# Patient Record
Sex: Female | Born: 1995 | Race: White | Hispanic: No | Marital: Single | Smoking: Never smoker
Health system: Southern US, Community
[De-identification: ages and names within clinical notes are randomized; demographics above are authoritative.]

## PROBLEM LIST (undated history)

## (undated) DIAGNOSIS — J302 Other seasonal allergic rhinitis: Secondary | ICD-10-CM

## (undated) DIAGNOSIS — J45909 Unspecified asthma, uncomplicated: Secondary | ICD-10-CM

## (undated) DIAGNOSIS — R0789 Other chest pain: Secondary | ICD-10-CM

## (undated) DIAGNOSIS — R002 Palpitations: Secondary | ICD-10-CM

## (undated) DIAGNOSIS — F419 Anxiety disorder, unspecified: Secondary | ICD-10-CM

## (undated) DIAGNOSIS — D164 Benign neoplasm of bones of skull and face: Secondary | ICD-10-CM

## (undated) DIAGNOSIS — R42 Dizziness and giddiness: Secondary | ICD-10-CM

## (undated) DIAGNOSIS — R000 Tachycardia, unspecified: Secondary | ICD-10-CM

## (undated) DIAGNOSIS — R55 Syncope and collapse: Secondary | ICD-10-CM

## (undated) DIAGNOSIS — R5383 Other fatigue: Secondary | ICD-10-CM

## (undated) HISTORY — DX: Tachycardia, unspecified: R00.0

## (undated) HISTORY — DX: Palpitations: R00.2

## (undated) HISTORY — PX: OTHER SURGICAL HISTORY: SHX169

## (undated) HISTORY — DX: Other fatigue: R53.83

## (undated) HISTORY — DX: Syncope and collapse: R55

## (undated) HISTORY — DX: Other chest pain: R07.89

## (undated) HISTORY — DX: Dizziness and giddiness: R42

## (undated) HISTORY — DX: Unspecified asthma, uncomplicated: J45.909

## (undated) HISTORY — DX: Benign neoplasm of bones of skull and face: D16.4

## (undated) HISTORY — PX: WISDOM TOOTH EXTRACTION: SHX21

## (undated) HISTORY — DX: Other seasonal allergic rhinitis: J30.2

## (undated) HISTORY — DX: Anxiety disorder, unspecified: F41.9

---
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
## 2013-01-30 ENCOUNTER — Encounter (HOSPITAL_COMMUNITY): Payer: Self-pay | Admitting: *Deleted

## 2013-01-30 ENCOUNTER — Emergency Department (HOSPITAL_COMMUNITY)
Admission: EM | Admit: 2013-01-30 | Discharge: 2013-01-30 | Disposition: A | Discharge status: Home / Self Care | Payer: BC Managed Care – PPO | Attending: Emergency Medicine | Admitting: Emergency Medicine

## 2013-01-30 ENCOUNTER — Emergency Department (HOSPITAL_COMMUNITY): Payer: BC Managed Care – PPO

## 2013-01-30 DIAGNOSIS — Z86011 Personal history of benign neoplasm of the brain: Secondary | ICD-10-CM | POA: Insufficient documentation

## 2013-01-30 DIAGNOSIS — R072 Precordial pain: Secondary | ICD-10-CM | POA: Insufficient documentation

## 2013-01-30 DIAGNOSIS — Z3202 Encounter for pregnancy test, result negative: Secondary | ICD-10-CM | POA: Insufficient documentation

## 2013-01-30 DIAGNOSIS — R55 Syncope and collapse: Secondary | ICD-10-CM

## 2013-01-30 HISTORY — DX: Syncope and collapse: R55

## 2013-01-30 NOTE — ED Notes (Signed)
Dr Radford Pax back in to see the pt

## 2013-01-30 NOTE — ED Notes (Signed)
The pt arrived by [facility] from [facility] where the pt felt faint and dropped to her knees but did not pass out. ems arrived and the pts heart rate was 140.  900 ml of nss infsued.  Heart rate in the 90s now.  She has had 2 other episodes in the past.  Iv per ems

## 2013-01-30 NOTE — ED Notes (Signed)
The pt is alert she last ate at 1430.  She had been outside for 1-2 hours.  lmp 2 weeks ago.  This is the third episode this month.  She lives in [place].  Her parents are on the way here

## 2013-01-30 NOTE — ED Provider Notes (Signed)
CSN: [id number]     Arrival date & time 01/30/13  1719 History   First MD Initiated Contact with Patient 01/30/13 1756     Chief Complaint  Patient presents with  . Near Syncope   (Consider location/radiation/quality/duration/timing/severity/associated sxs/prior Treatment) Patient is a 17 y.o. female presenting with general illness.  Illness Location:  Generalized Quality:  Near syncope Severity:  Moderate Onset quality:  Sudden Duration: 10 minutes. Timing:  Constant Progression:  Partially resolved Chronicity:  New Context:  While exercising Relieved by:  Rest and lying down Worsened by:  Exercise Associated symptoms: chest pain   Associated symptoms: no abdominal pain, no congestion, no cough, no diarrhea, no fever, no loss of consciousness, no nausea, no rash, no rhinorrhea, no shortness of breath, no sore throat and no vomiting     Past Medical History  Diagnosis Date  . Brain tumor    History reviewed. No pertinent past surgical history. No family history on file. History  Substance Use Topics  . Smoking status: Never Smoker   . Smokeless tobacco: Not on file  . Alcohol Use: No   OB History   Grav Para Term Preterm Abortions TAB SAB Ect Mult Living                 Review of Systems  Constitutional: Negative for fever and chills.  HENT: Negative for congestion, sore throat and rhinorrhea.   Eyes: Negative for photophobia and visual disturbance.  Respiratory: Negative for cough and shortness of breath.   Cardiovascular: Positive for chest pain. Negative for leg swelling.  Gastrointestinal: Negative for nausea, vomiting, abdominal pain, diarrhea and constipation.  Endocrine: Negative for polyphagia and polyuria.  Genitourinary: Negative for dysuria, flank pain, vaginal bleeding, vaginal discharge and enuresis.  Musculoskeletal: Negative for back pain and gait problem.  Skin: Negative for color change and rash.  Neurological: Negative for dizziness, loss of  consciousness, syncope, light-headedness and numbness.  Hematological: Negative for adenopathy. Does not bruise/bleed easily.  All other systems reviewed and are negative.    Allergies  Tomato  Home Medications  No current outpatient prescriptions on file. BP 115/69  Pulse 85  Temp(Src) 98.2 F (36.8 C) (Oral)  Resp 18  SpO2 100%  LMP 01/23/2013 Physical Exam  Vitals reviewed. Constitutional: She is oriented to person, place, and time. She appears well-developed and well-nourished.  HENT:  Head: Normocephalic and atraumatic.  Right Ear: External ear normal.  Left Ear: External ear normal.  Eyes: Conjunctivae and EOM are normal. Pupils are equal, round, and reactive to light.  Neck: Normal range of motion. Neck supple.  Cardiovascular: Normal rate, regular rhythm, normal heart sounds and intact distal pulses.   Pulmonary/Chest: Effort normal and breath sounds normal.  Abdominal: Soft. Bowel sounds are normal. There is no tenderness.  Musculoskeletal: Normal range of motion.  Neurological: She is alert and oriented to person, place, and time. She has normal strength and normal reflexes. No cranial nerve deficit or sensory deficit. Gait normal. GCS eye subscore is 4. GCS verbal subscore is 5. GCS motor subscore is 6.  Skin: Skin is warm and dry.    ED Course  Procedures (including critical care time) Labs Review Labs Reviewed  POCT PREGNANCY, URINE  Collection Time Result Time Preg Test, Ur 01/30/13 18:38:00 01/30/13 18:38:14 NEGATIVE THE SENSITIVITY OF THIS METHODOLOGY IS >24 mIU/mL  Imaging Review Dg Chest 2 View  01/30/2013   *RADIOLOGY REPORT*  Clinical Data: Shortness of breath, chest pain and cough.  CHEST -  2 VIEW  Comparison: None  Findings: The cardiomediastinal silhouette is unremarkable. The lungs are clear. There is no evidence of focal airspace disease, pulmonary edema, suspicious pulmonary nodule/mass, pleural effusion, or pneumothorax. No acute bony  abnormalities are identified.  IMPRESSION: No evidence of active cardiopulmonary disease.   Original Report Authenticated By: Harmon Pier, M.D.      Date: 01/30/2013  Rate: 99  Rhythm: normal sinus rhythm  QRS Axis: normal  Intervals: normal  ST/T Wave abnormalities: normal  Conduction Disutrbances:none  Narrative Interpretation:   Old EKG Reviewed: none available   MDM   1. Vasovagal near syncope    17 y.o. female  without pertinent PMH presents with near syncope today while exercising.  She has had similar episodes in the past and has not seen a physician for.  Physical exam on arrival unremarkable, pt with mild chest pain, substernal, nonradiating.  CXR and ecg as above unremarkable.  Consider likely etiology vasovagal near syncope, however given nature of symptoms will have pt fu with cardiology for holter monitor.  Pt given standard return precautions for syncope/near syncope, voiced understanding, agreed with plan, and will fu.  Doubt PNA, PTX, ACS, PE, or other symptoms given clinical picture, lack of dyspnea, and well appearance.    Labs and imaging as above reviewed by myself and attending,Dr. Radford Pax, with whom case was discussed.   1. Vasovagal near syncope         Noel Gerold, MD 01/31/13 [phone]

## 2013-01-30 NOTE — ED Notes (Signed)
Pt up to the br urine sent.  The pt has some mild lt chest pain still

## 2013-01-30 NOTE — ED Notes (Signed)
The pt returned from xray.  Alert no distress 

## 2013-01-30 NOTE — ED Notes (Signed)
The pts parents just arrived.  Her friends are gone.  The pt told her parents that she ran a marathon today earlier

## 2013-01-30 NOTE — ED Notes (Signed)
To x-ray

## 2013-01-31 NOTE — ED Provider Notes (Signed)
I saw and evaluated the patient, reviewed the resident's note and I agree with the findings and plan.   .Face to face Exam:  General:  Awake HEENT:  Atraumatic Resp:  Normal effort Abd:  Nondistended Neuro:No focal weakness   Nelia Shi, MD 01/31/13 1135

## 2013-02-03 DIAGNOSIS — R0789 Other chest pain: Secondary | ICD-10-CM

## 2013-02-03 HISTORY — DX: Other chest pain: R07.89

## 2013-02-19 ENCOUNTER — Ambulatory Visit (INDEPENDENT_AMBULATORY_CARE_PROVIDER_SITE_OTHER): Payer: BC Managed Care – PPO | Admitting: Cardiology

## 2013-02-19 ENCOUNTER — Encounter: Payer: Self-pay | Admitting: Cardiology

## 2013-02-19 VITALS — BP 120/80 | HR 72 | Ht 66.0 in | Wt 139.0 lb

## 2013-02-19 DIAGNOSIS — R55 Syncope and collapse: Secondary | ICD-10-CM

## 2013-02-19 LAB — CBC WITH DIFFERENTIAL/PLATELET
Basophils Absolute: 0 10*3/uL (ref 0.0–0.1)
Basophils Relative: 0.4 % (ref 0.0–3.0)
Eosinophils Absolute: 0.3 10*3/uL (ref 0.0–0.7)
Eosinophils Relative: 3.6 % (ref 0.0–5.0)
HCT: 40.7 % (ref 36.0–46.0)
Hemoglobin: 13.5 g/dL (ref 12.0–15.0)
Lymphocytes Relative: 23.2 % (ref 12.0–46.0)
Lymphs Abs: 1.7 10*3/uL (ref 0.7–4.0)
MCHC: 33.2 g/dL (ref 30.0–36.0)
MCV: 88.6 fl (ref 78.0–100.0)
Monocytes Absolute: 0.4 10*3/uL (ref 0.1–1.0)
Monocytes Relative: 6.1 % (ref 3.0–12.0)
Neutro Abs: 4.7 10*3/uL (ref 1.4–7.7)
Neutrophils Relative %: 66.7 % (ref 43.0–77.0)
Platelets: 287 10*3/uL (ref 150.0–400.0)
RBC: 4.59 Mil/uL (ref 3.87–5.11)
RDW: 12.8 % (ref 11.5–14.6)
WBC: 7.1 10*3/uL (ref 4.5–10.5)

## 2013-02-19 LAB — BASIC METABOLIC PANEL
BUN: 10 mg/dL (ref 6–23)
CO2: 29 mEq/L (ref 19–32)
Calcium: 9.6 mg/dL (ref 8.4–10.5)
Chloride: 103 mEq/L (ref 96–112)
Creatinine, Ser: 0.7 mg/dL (ref 0.4–1.2)
GFR: 113.94 mL/min (ref >60.00)
Glucose, Bld: 95 mg/dL (ref 70–99)
Potassium: 3.9 mEq/L (ref 3.5–5.1)
Sodium: 140 mEq/L (ref 135–145)

## 2013-02-19 LAB — TSH: TSH: 1.48 u[IU]/mL (ref 0.35–5.50)

## 2013-02-19 NOTE — Progress Notes (Signed)
Patient ID: Micala Saltsman, female   DOB: 07-02-1995, 17 y.o.   MRN: [id number]    Patient Name: Ariana Donovan Date of Encounter: 02/19/2013  Primary Care Provider:  No PCP Per Patient Primary Cardiologist:  Tobias Alexander, H   Patient Profile  Post ER visit  Problem List   Past Medical History  Diagnosis Date  . Brain tumor    History reviewed. No pertinent past surgical history.  Allergies  Allergies  Allergen Reactions  . Tomato Hives    HPI  17 year old female without pertinent PMH who visited ER on 01/30/2013 for near syncope. The episode happened while she was exercising.  She has had similar episodes in the past and has not seen a physician for. She also complained of mild chest pain, substernal, nonradiating.  CXR and physical exam were unremarkable. ECG showed rightward axis. Otherwise normal.  It was concluded as vasovagal syncope and she was sent home.   The patient describes that she has had 5 of those episodes since the Labor day. They all happened on different occasions, one time she was running relay, other time she walked to the gas station, another time she was watching TV and fell on the coach. Every time she looses vision and eventually concioussness, she is then confused for about 5 minutes based on her roommate interview. She denies urinary incontinence, muscle twitching or involuntary movements. She states that some of them were preceded by palpitations, in others she was not aware of them. On one occasion when EMS was called her HR was 160. She states that her episodes are followed by midsternal chest pain.   Home Medications  Prior to Admission medications   Not on File    Family History  History reviewed. No pertinent family history.  Social History  History   Social History  . Marital Status: Single    Spouse Name: N/A    Number of Children: N/A  . Years of Education: N/A   Occupational History  . Not on file.   Social History  Main Topics  . Smoking status: Never Smoker   . Smokeless tobacco: Not on file  . Alcohol Use: No  . Drug Use: Not on file  . Sexual Activity: Not on file   Other Topics Concern  . Not on file   Social History Narrative  . No narrative on file     Review of Systems General:  No chills, fever, night sweats or weight changes.  Cardiovascular:  No chest pain, dyspnea on exertion, edema, orthopnea, palpitations, paroxysmal nocturnal dyspnea. Dermatological: No rash, lesions/masses Respiratory: No cough, dyspnea Urologic: No hematuria, dysuria Abdominal:   No nausea, vomiting, diarrhea, bright red blood per rectum, melena, or hematemesis Neurologic:  No visual changes, wkns, changes in mental status. All other systems reviewed and are otherwise negative except as noted above.  Physical Exam  Blood pressure 120/80, pulse 72, height 5\' 6"  (1.676 m), weight 139 lb (63.05 kg), last menstrual period 01/23/2013.  General: Pleasant, NAD Psych: Normal affect. Neuro: Alert and oriented X 3. Moves all extremities spontaneously. HEENT: Normal  Neck: Supple without bruits or JVD. Lungs:  Resp regular and unlabored, CTA. Heart: RRR no s3, s4, or murmurs. Abdomen: Soft, non-tender, non-distended, BS + x 4.  Extremities: No clubbing, cyanosis or edema. DP/PT/Radials 2+ and equal bilaterally.  Accessory Clinical Findings  ECG - SR, 72 BPM, rightward axis  Assessment & Plan  A very pleasant young female with recurrent syncope in different  situations. It is somewhat concerning and the differential is broad. We have to rule out a structural heart disease and a flow limiting arrhythmia. We will order an event monitor to wear for a month and an echocardiogram.  We will also order BMP, TSH and CBC. We will refer her to a neurologist as a prolonged confusion is concerning for seizure disorder.   Follow up in 1 months unless there are any abnormal findings that would warrant an earlier  appointment.   Tobias Alexander, Rexene Edison, MD  02/19/2013, 1:59 PM

## 2013-02-19 NOTE — Patient Instructions (Addendum)
**Note De-Identified  Obfuscation** Your physician has recommended that you wear an event monitor. Event monitors are medical devices that record the heart's electrical activity. Doctors most often [place] these monitors to diagnose arrhythmias. Arrhythmias are problems with the speed or rhythm of the heartbeat. The monitor is a small, portable device. You can wear one while you do your normal daily activities. This is usually used to diagnose what is causing palpitations/syncope (passing out). You will wear for 30 days.  Your physician has requested that you have an echocardiogram. Echocardiography is a painless test that uses sound waves to create images of your heart. It provides your doctor with information about the size and shape of your heart and how well your heart's chambers and valves are working. This procedure takes approximately one hour. There are no restrictions for this procedure.  Your physician recommends that you return for lab work in: today  Your provider recommends that you see a Neurologist concerning syncope (passing out)  Your physician recommends that you schedule a follow-up appointment in: after Echo and Event monitor is completed.

## 2013-03-08 ENCOUNTER — Ambulatory Visit (INDEPENDENT_AMBULATORY_CARE_PROVIDER_SITE_OTHER): Payer: BC Managed Care – PPO | Admitting: Neurology

## 2013-03-08 ENCOUNTER — Encounter: Payer: Self-pay | Admitting: Neurology

## 2013-03-08 VITALS — BP 100/58 | HR 70 | Temp 97.9°F | Ht 67.0 in | Wt 145.0 lb

## 2013-03-08 DIAGNOSIS — R55 Syncope and collapse: Secondary | ICD-10-CM

## 2013-03-08 NOTE — Progress Notes (Signed)
NEUROLOGY CONSULTATION NOTE  Ariana Donovan MRN: [id number] DOB: 03/18/1996  Referring provider: Dr. Delton See (cardiology).  Reason for consult:  Syncope vs Seizure  HISTORY OF PRESENT ILLNESS: Ariana Donovan is a 17 year old right-handed woman with remote history of benign scalp tumor resection who presents for episodes of syncope.  Records and images were personally reviewed where available.    She reportedly had 8 episodes since Labor Day.  Semiology is pretty much the same.  They can occur while running, walking or sitting on the couch.  She will experience palpitations, diaphoresis, overheating and tunnel vision, followed by blacking out.  There is no preceding lightheadedness.  She reportedly is unconscious for 3 minutes.  There are no convulsions, focal body twitching, posturing, tongue biting, or urinary or bladder incontinence.  When she wakes up, she is sometimes confused for 2-3 minutes, but not all the time.  They do not seem to be triggered by anything in particular.  These have been witnessed events.  Last episode occurred this past weekend while at a party.  She presented to the ED in September, where ECG showed rightward axis.  She was diagnosed with vasovagal syncope.  She followed up with cardiology on 02/19/13.  Event monitor and echocardiogram were ordered, which will be started tomorrow.  Given the confusion, there was concern for possible seizure disorder prompted referral to neurology.  She does not have past history of seizures.  She does not have history of head trauma or encephalitis.  She has family history of epilepsy with distant cousins.  She does not use drugs.  She does drive.  02/19/13:  CBC, BMP and TSH normal.  PAST MEDICAL HISTORY: Past Medical History  Diagnosis Date  . Brain tumor   . Asthma     PAST SURGICAL HISTORY: Past Surgical History  Procedure Laterality Date  . Wisdom tooth extraction      MEDICATIONS: No current outpatient prescriptions  on file prior to visit.   No current facility-administered medications on file prior to visit.    ALLERGIES: Allergies  Allergen Reactions  . Tomato Hives    FAMILY HISTORY: No family history on file.  SOCIAL HISTORY: History   Social History  . Marital Status: Single    Spouse Name: N/A    Number of Children: N/A  . Years of Education: N/A   Occupational History  . Not on file.   Social History Main Topics  . Smoking status: Never Smoker   . Smokeless tobacco: Never Used  . Alcohol Use: No     Comment: socially  . Drug Use: No  . Sexual Activity: Not on file   Other Topics Concern  . Not on file   Social History Narrative  . No narrative on file    REVIEW OF SYSTEMS: Constitutional: No fevers, chills, or sweats, no generalized fatigue, change in appetite Eyes: No visual changes, double vision, eye pain Ear, nose and throat: No hearing loss, ear pain, nasal congestion, sore throat Cardiovascular: No chest pain, palpitations Respiratory:  No shortness of breath at rest or with exertion, wheezes GastrointestinaI: No nausea, vomiting, diarrhea, abdominal pain, fecal incontinence Genitourinary:  No dysuria, urinary retention or frequency Musculoskeletal:  No neck pain, back pain Integumentary: No rash, pruritus, skin lesions Neurological: as above Psychiatric: No depression, insomnia, anxiety Endocrine: No palpitations, fatigue, diaphoresis, mood swings, change in appetite, change in weight, increased thirst Hematologic/Lymphatic:  No anemia, purpura, petechiae. Allergic/Immunologic: no itchy/runny eyes, nasal congestion, recent allergic reactions, rashes  PHYSICAL EXAM: Filed Vitals:   03/08/13 0849  BP: 100/58  Pulse: 70  Temp: 97.9 F (36.6 C)   General: No acute distress Head:  Normocephalic/atraumatic Neck: supple, no paraspinal tenderness, full range of motion Back: No paraspinal tenderness Heart: regular rate and rhythm Lungs: Clear to  auscultation bilaterally. Vascular: No carotid bruits. Neurological Exam: Mental status: alert and oriented to person, place, and time, speech fluent and not dysarthric, language intact. Cranial nerves: CN I: not tested CN II: pupils equal, round and reactive to light, visual fields intact, fundi unremarkable. CN III, IV, VI:  full range of motion, no nystagmus, no ptosis CN V: facial sensation intact CN VII: upper and lower face symmetric CN VIII: hearing intact CN IX, X: gag intact, uvula midline CN XI: sternocleidomastoid and trapezius muscles intact CN XII: tongue midline Bulk & Tone: normal, no fasciculations. Motor: 5/5 throughout Sensation: temperature and pinprick intact Deep Tendon Reflexes: 2+ throughout, toes down Finger to nose testing: normal Heel to shin: normal Gait: normal.  Able to walk on toes, heels and in tandem. Romberg negative.  IMPRESSION: Recurrent syncope.  The semiology of the events are overwhelmingly consistent with syncope (vasovagal or cardiogenic given the palpitations). However, the reported occasional confusion prompts seizure workup as well.   PLAN: 1.  MRI of brain with seizure protocol 2.  Sleep-deprived EEG 3.  I will forego starting an antiepileptic at this time, as the symptoms seem more likely syncope than seizure.  We will see what test results show. 4.  Follow up in 6 weeks, after results of tests, including event monitor. 5.  Since these seem to be unprovoked events (regardless of being seizures), I discussed [place] law regarding driving restrictions for 6 months from last loss of consciousness event.  45 minutes spent with patient, over 50% spent counseling and coordinating care.  Thank you for allowing me to take part in the care of this patient.  Shon Millet, DO  CC:  Tobias Alexander, MD.

## 2013-03-08 NOTE — Patient Instructions (Addendum)
Your episodes sound like fainting, except for the confusion afterwards.  Otherwise, they really don't seem like seizures to me. 1.  We will see what the heart monitor shows. 2.  We will also get an MRI of the brain and EEG to record your brainwaves, to look for any evidence of seizure-risk. 3.  We won't start a seizure medication at this time, but we can start one pending results of the tests or if you keep having spells. 4.  [place] law restricts driving for 6 months from last seizure.  Even though these may not be seizures, they are unprovoked events causing loss of consciousness and this applies as well.  I recommend making arrangements for transportation, such as help from friends. 5.  Follow up in 6 weeks.  Your sleep deprived EEG is scheduled at [facility] on Thursday, November 13th at 8:00 am.  Please arrive at first floor admitting fifteen minutes prior to your appointment. Stay awake from midnight forward until your test time. Avoid caffeine. Enter the hospital off of [address] at [address].    [phone].  Your MRI is scheduled at [facility] on Thursday, November 6th at 2:00 pm. Please check in at the first floor radiology department 15 minutes prior to your scheduled appointment time. Enter the hospital off of [address] at [address].     [phone].

## 2013-03-09 ENCOUNTER — Encounter (INDEPENDENT_AMBULATORY_CARE_PROVIDER_SITE_OTHER): Payer: BC Managed Care – PPO

## 2013-03-09 ENCOUNTER — Ambulatory Visit (HOSPITAL_COMMUNITY): Payer: BC Managed Care – PPO | Attending: Cardiology | Admitting: Radiology

## 2013-03-09 ENCOUNTER — Encounter: Payer: Self-pay | Admitting: *Deleted

## 2013-03-09 DIAGNOSIS — R55 Syncope and collapse: Secondary | ICD-10-CM

## 2013-03-09 NOTE — Progress Notes (Signed)
Echocardiogram performed.  

## 2013-03-09 NOTE — Progress Notes (Signed)
Patient ID: Ariana Donovan, female   DOB: May 08, 1995, 17 y.o.   MRN: [id number] E-Cardio Verite 30 day cardiac event monitor applied to patient.

## 2013-03-10 ENCOUNTER — Telehealth: Payer: Self-pay

## 2013-03-10 ENCOUNTER — Telehealth: Payer: Self-pay | Admitting: Internal Medicine

## 2013-03-10 ENCOUNTER — Telehealth: Payer: Self-pay | Admitting: Nurse Practitioner

## 2013-03-10 MED ORDER — METOPROLOL TARTRATE 25 MG PO TABS: 25.0000 mg | ORAL_TABLET | Freq: Two times a day (BID) | ORAL | Status: DC

## 2013-03-10 NOTE — Telephone Encounter (Signed)
Pt is advised per Dr. Delton See not to drive until she is evaluated by Dr Johney Frame on 11/7, she verbalized understanding.

## 2013-03-10 NOTE — Telephone Encounter (Signed)
Received eCardio report of SVT at rate of 161 yesterday at 4:43 pm.  I called patient who reports she did get a call from [facility] yesterday and that at the time of the recorded event she was watching television.  Patient states at the time she did feel that her heart was beating "a little bit fast."  Patient reports this morning that she is a little nauseated but that this is not uncommon for her in the mornings.  I had patient to check her pulse while we were on the telephone and it is approximately 116 bpm.  I advised patient that I will report to Dr. Delton See and that if she has any advice, that we will call her back; otherwise to call us if she has concerns or questions.

## 2013-03-10 NOTE — Telephone Encounter (Signed)
**Note De-Identified  Obfuscation** Per Dr. Delton See the Ariana Donovan needs a consult with EP for SVT per event monitor. The Ariana Donovan is advised and she agrees with plan so an appt has been scheduled for the Ariana Donovan to see Dr Johney Frame on 11/7 (this Friday) at 8:45. Also, per Dr Delton See the Ariana Donovan is advised to start taking Metoprolol 25 mg BID and that if she has fatigue weakness or dizziness after starting Metoprolol to cut dose in half, she verbalized understanding. RX sent to [facility] on [address] to fill per Ariana Donovan request.

## 2013-03-10 NOTE — Telephone Encounter (Signed)
Following up     Pt is returning your call.  Pt thinks the call is about a scheduling issue.

## 2013-03-11 ENCOUNTER — Telehealth: Payer: Self-pay

## 2013-03-11 ENCOUNTER — Ambulatory Visit (HOSPITAL_COMMUNITY): Admission: RE | Admit: 2013-03-11 | Payer: BC Managed Care – PPO | Source: Ambulatory Visit

## 2013-03-11 NOTE — Telephone Encounter (Signed)
Pt is scheduled to see Dr Johney Frame on 03/15/13 @ 9 am for SVT per Dr Delton See.

## 2013-03-12 ENCOUNTER — Telehealth: Payer: Self-pay | Admitting: *Deleted

## 2013-03-12 ENCOUNTER — Institutional Professional Consult (permissible substitution): Payer: BC Managed Care – PPO | Admitting: Internal Medicine

## 2013-03-12 NOTE — Telephone Encounter (Signed)
Left message for patient to call back. E-Cardio report showing ST/SVT, rates 155 , 164. Patient reported asymptomatic at home sitting watching television. Dr. Jens Som (DOD) reviewed, no new orders received - only to continue current therapy with Metoprolol.

## 2013-03-12 NOTE — Telephone Encounter (Signed)
Left message - told patient to only call back if she was symptomatic. She was returning my phone call from [facility] report showing ST/SVT. See other telephone message from today for greater detail.

## 2013-03-12 NOTE — Telephone Encounter (Signed)
Folow Up:  Pt states she is returning Sherri's call... Please advise

## 2013-03-15 ENCOUNTER — Encounter: Payer: Self-pay | Admitting: Internal Medicine

## 2013-03-15 ENCOUNTER — Ambulatory Visit (INDEPENDENT_AMBULATORY_CARE_PROVIDER_SITE_OTHER): Payer: BC Managed Care – PPO | Admitting: Internal Medicine

## 2013-03-15 VITALS — BP 100/70 | HR 88 | Ht 67.0 in | Wt 143.1 lb

## 2013-03-15 DIAGNOSIS — R55 Syncope and collapse: Secondary | ICD-10-CM | POA: Insufficient documentation

## 2013-03-15 NOTE — Progress Notes (Signed)
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 female with a h/o recurrent syncope who presents for EP consultation.  She reports being healthy, without any cardiology concerns until 8/14.  Since that time, she has had multiple episodes of dizziness, presyncope, and syncope.  She reports that her first episode occurred labor day weekend.  She was driving and felt that her vision was blurred.  She felt "fuzzy headed".  She pulled over and went into a convenience store.  Upon walking in, she collapsed with brief loss of consciousness.  She reports that her syncope was very brief (few seconds) though it did take her several minutes to regain composure.  She was evaluated by EMS and sent home.  She reports that about a month ago she was running a relay.  She felt fine while running.  Upon immediately stopping the run, she had abrupt lightheadedness with chest tightness and layed down.  She did not have full loss of consciousness but did have presyncope.  She has occasional tachypalpitations.  She is wearing an event monitor but has not had syncope while wearing it.  She does have occasional dizziness.  Her monitor has revealed only sinus rhythm and sinus tachycardia. Today, she denies symptoms of exertional chest pain, shortness of breath, orthopnea, PND, lower extremity edema, or neurologic sequela. The patient is tolerating medications without difficulties and is otherwise without complaint today.   Past Medical History  Diagnosis Date  . 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 Asthma   . Near syncope 01/30/13  . Heart palpitations     Happened during one of her syncope episodes  . Chest pain, mid sternal     Happens during syncope episodes   Past Surgical History  Procedure Laterality Date  . Wisdom tooth extraction    . Skull tumor resection      Current Outpatient Prescriptions  Medication Sig Dispense Refill  . metoprolol tartrate (LOPRESSOR) 25 MG tablet Take 1  tablet (25 mg total) by mouth 2 (two) times daily.  60 tablet  3   No current facility-administered medications for this visit.    Allergies  Allergen Reactions  . Tomato Hives    History   Social History  . Marital Status: Single    Spouse Name: N/A    Number of Children: N/A  . Years of Education: N/A   Occupational History  . Not on file.   Social History Main Topics  . Smoking status: Never Smoker   . Smokeless tobacco: Never Used  . Alcohol Use: Yes     Comment: socially  . Drug Use: No  . Sexual Activity: Not on file   Other Topics Concern  . 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    Family History  Problem Relation Age of Onset  . Ataxia Neg Hx   . Chorea Neg Hx   . Dementia Neg Hx   . Mental retardation Neg Hx   . Migraines Neg Hx   . Multiple sclerosis Neg Hx   . Neurofibromatosis Neg Hx   . Neuropathy Neg Hx   . Parkinsonism Neg Hx   . Seizures Neg Hx   . Stroke Neg Hx   . Heart disease Maternal Grandmother   . Heart disease Maternal Aunt   denies FH of sudden death, syncope, or arrhythmias  ROS- All systems are reviewed and negative except  as per the HPI above  Physical Exam: Filed Vitals:   03/15/13 1035 03/15/13 1036 03/15/13 1037 03/15/13 1038  BP: 106/71 108/74 109/75 100/70  Pulse: 76 80 81 88  Height:      Weight:      SpO2: 90% 95% 97% 98%    GEN- The patient is well appearing, alert and oriented x 3 today.   Head- normocephalic, atraumatic Eyes-  Sclera clear, conjunctiva pink Ears- hearing intact Oropharynx- clear Neck- supple, no JVP Lymph- no cervical lymphadenopathy Lungs- Clear to ausculation bilaterally, normal work of breathing Heart- Regular rate and rhythm, no murmurs, rubs or gallops, PMI not laterally displaced GI- soft, NT, ND, + BS Extremities- no clubbing, cyanosis, or edema MS- no significant deformity or atrophy Skin- no rash or lesion Psych- euthymic mood, full  affect Neuro- strength and sensation are intact  EKG 02/19/13- sinus rhythm 72 bpm, RAD, QTc 383 Echo 03/09/13- EF 55-60% with normal RV size/ function  Assessment and Plan:  1. Syncope She is orthostatic by HR today.  I suspect that she likely has mild dysautonomia.  I have encouraged lifestyle modification including increasing salt/ hydration and avoidance of quick change in position or quick cessation of activity.  She will continue to wear her event monitor and liberalize her salt.  I have stopped metoprolol today.  She will return in 6 weeks to follow-up on results of her monitor.  I have instructed her to not drive with these episodes ongoing.

## 2013-03-15 NOTE — Patient Instructions (Signed)
Your physician recommends that you schedule a follow-up appointment in: 6 weeks with Dr Johney Frame   Increase SALT and Stay hydrated

## 2013-03-17 ENCOUNTER — Telehealth: Payer: Self-pay | Admitting: Nurse Practitioner

## 2013-03-17 NOTE — Telephone Encounter (Signed)
Received eCardio report with episode of questionable SVT/Sinus Tachycardia.  I called patient who reports that at the time of the recorded event (0952) she was in the bed.  Patient states she got out of bed this morning at approximately 1000.  Patient reports that later this morning, at approximately 1130 she felt nauseated and light-headed and thought she was going to pass out but didn't.  Patient reports that she has increased sodium in her diet and reports drinking at least 64 oz water daily.  I advised patient that I will report monitor findings to Drs. Allred and Delton See and that someone from our office will call her if medication and/or treatment changes are necessary.  I advised patient to call the office with concerns or questions.  Patient verbalized understanding.

## 2013-03-18 ENCOUNTER — Ambulatory Visit (HOSPITAL_COMMUNITY)
Admission: RE | Admit: 2013-03-18 | Discharge: 2013-03-18 | Disposition: A | Discharge status: Home / Self Care | Payer: BC Managed Care – PPO | Source: Ambulatory Visit | Attending: Neurology | Admitting: Neurology

## 2013-03-18 ENCOUNTER — Telehealth: Payer: Self-pay | Admitting: Neurology

## 2013-03-18 DIAGNOSIS — R55 Syncope and collapse: Secondary | ICD-10-CM

## 2013-03-18 DIAGNOSIS — F29 Unspecified psychosis not due to a substance or known physiological condition: Secondary | ICD-10-CM | POA: Insufficient documentation

## 2013-03-18 DIAGNOSIS — J3489 Other specified disorders of nose and nasal sinuses: Secondary | ICD-10-CM | POA: Insufficient documentation

## 2013-03-18 MED ORDER — GADOBENATE DIMEGLUMINE 529 MG/ML IV SOLN
15.0000 mL | Freq: Once | INTRAVENOUS | Status: AC | PRN
  Administered 2013-03-18: 13 mL via INTRAVENOUS

## 2013-03-18 NOTE — Telephone Encounter (Signed)
Spoke with Lurena Joiner. Informed MRI was normal. No other question/concerns voiced at this time.

## 2013-03-18 NOTE — Progress Notes (Signed)
Sleep deprived adult EEG completed. 

## 2013-03-18 NOTE — Telephone Encounter (Signed)
Message copied by Benay Spice on Thu Mar 18, 2013  3:34 PM ------      Message from: JAFFE, ADAM R      Created: Thu Mar 18, 2013 11:50 AM       MRI of brain is normal.      ----- Message -----         From: Rad Results In Interface         Sent: 03/18/2013  11:38 AM           To: Cira Servant, DO                   ------

## 2013-03-19 ENCOUNTER — Telehealth: Payer: Self-pay | Admitting: Internal Medicine

## 2013-03-19 NOTE — Telephone Encounter (Signed)
New Problem:  Pt is scheduled to be wearing a monitor. Pt states she has not been wearing the monitor since Tuesday due to being out of electrodes. Pt states she called the company and they are shipping her some more but she has not received them. The pt would like to know if she can come in to pick some electrodes up. I tried calling the treadmill room multiple times but no one answered... Please advise pt.Marland KitchenMarland Kitchen

## 2013-03-19 NOTE — Telephone Encounter (Signed)
Please bring strips to me For now, continue with current plans and follow-up

## 2013-03-19 NOTE — Procedures (Signed)
ELECTROENCEPHALOGRAM REPORT  Date of Study: 03/18/2013  Patient's Name: Ariana Donovan MRN: [id number] Date of Birth: 12-Aug-1995  Indication: Syncope, sometimes with occasional confusion afterwards  Medications: None  Technical Summary: This is a multichannel digital EEG recording, using the international 10-20 placement system.  Spike detection software was employed.  Description: The EEG background is symmetric, with a well-developed posterior dominant rhythm of 12 Hz, which is reactive to eye opening and closing.  Diffuse beta activity is seen, with a bilateral frontal preponderance.  No focal or generalized abnormalities are seen.  No focal or generalized epileptiform discharges are seen.  Stage II sleep is seen, with normal and symmetric sleep patterns.  Hyperventilation and photic stimulation were performed, and produced no abnormalities.  ECG revealed normal cardiac rate and rhythm.  Impression: This is a normal sleep deprived EEG of the awake and asleep states, with activating procedures.  A normal study does not rule out the possibility of a seizure disorder in this patient.  Foday Cone R. Everlena Cooper, DO

## 2013-03-22 NOTE — Telephone Encounter (Signed)
Spoke with Ariana Donovan she was going to leave out front but the patient received the electrodes on Sat.

## 2013-03-22 NOTE — Telephone Encounter (Signed)
Took eCardio reports to Dr. Jenel Lucks nurse Dennis Bast, RN for Dr. Jenel Lucks review

## 2013-03-25 ENCOUNTER — Telehealth: Payer: Self-pay | Admitting: *Deleted

## 2013-03-25 NOTE — Telephone Encounter (Signed)
AJ with e-cardio calls to report a 40 sec run of SVT with rate up to 165 recently.    They did contact the pt & she was feeling light headed while sitting down.  Stated the follow-up rhythm showed NSR  I spoke with pt & she was sitting in class when she felt lightheaded earlier this afternoon. States she is feeling fine now.  They will fax a copy of the rhythm strip to both Dr. Johney Frame & Dr. Henderson Newcomer RN

## 2013-03-26 ENCOUNTER — Telehealth: Payer: Self-pay | Admitting: Neurology

## 2013-03-26 NOTE — Telephone Encounter (Signed)
Message copied by Benay Spice on Fri Mar 26, 2013 11:37 AM ------      Message from: JAFFE, ADAM R      Created: Wed Mar 24, 2013 12:21 PM       EEG is normal.  Therefore, I don't think we need to proceed with MRI of brain.  Follow up after event monitor.      ----- Message -----         From: Cira Servant, DO         Sent: 03/19/2013   6:19 AM           To: Cira Servant, DO                   ------

## 2013-03-26 NOTE — Telephone Encounter (Signed)
Spoke with Ariana Donovan. Informed EEG was normal. Currently wearing the event monitor. Will f/u with Dr. Everlena Cooper on 12/18. No additional concerns voiced at this time.

## 2013-03-31 ENCOUNTER — Telehealth: Payer: Self-pay | Admitting: Cardiology

## 2013-03-31 NOTE — Telephone Encounter (Signed)
New problem    Regarding a serious EKG

## 2013-03-31 NOTE — Telephone Encounter (Signed)
I called Pt and she states that she is doing well and is unaware of episode of SVT/Sinus tachycardia. eCardio report given to Dr Johney Frame for his review. Per Dr Johney Frame the pt is advised to continue to wear monitor, she verbalized understanding.

## 2013-04-06 ENCOUNTER — Telehealth: Payer: Self-pay | Admitting: Nurse Practitioner

## 2013-04-06 NOTE — Telephone Encounter (Signed)
Spoke with patient who denies adverse symptoms due to eCardio report of heart rate in the 180's.  Patient states she feels well now; unaware of elevated heart rate earlier.  I advised patient to call the office with symptoms.  Patient verbalized understanding and agreement.

## 2013-04-14 ENCOUNTER — Ambulatory Visit: Payer: BC Managed Care – PPO | Admitting: Cardiology

## 2013-04-21 ENCOUNTER — Ambulatory Visit: Payer: BC Managed Care – PPO | Admitting: Internal Medicine

## 2013-04-22 ENCOUNTER — Telehealth: Payer: Self-pay | Admitting: Neurology

## 2013-04-22 ENCOUNTER — Ambulatory Visit: Payer: BC Managed Care – PPO | Admitting: Neurology

## 2013-04-22 NOTE — Telephone Encounter (Signed)
Pt no showed 6 week follow up sch for 04/22/13 w/ Dr. Everlena Cooper. No show letter sent to patient / Roanna Raider

## 2013-05-04 ENCOUNTER — Ambulatory Visit (INDEPENDENT_AMBULATORY_CARE_PROVIDER_SITE_OTHER): Payer: BC Managed Care – PPO | Admitting: Cardiology

## 2013-05-04 ENCOUNTER — Encounter: Payer: Self-pay | Admitting: Cardiology

## 2013-05-04 ENCOUNTER — Encounter (INDEPENDENT_AMBULATORY_CARE_PROVIDER_SITE_OTHER): Payer: Self-pay

## 2013-05-04 VITALS — BP 111/71 | HR 83 | Ht 67.0 in | Wt 145.0 lb

## 2013-05-04 DIAGNOSIS — R079 Chest pain, unspecified: Secondary | ICD-10-CM

## 2013-05-04 NOTE — Patient Instructions (Addendum)
Schedule Treadmill follow instructions given   Keep appointment with Dr.Allred 05/19/13 at 8:30 am

## 2013-05-04 NOTE — Progress Notes (Signed)
Patient ID: Ariana Donovan, female   DOB: 10/29/1995, 17 y.o.   MRN: [id number]     Patient Name: Ariana Donovan Date of Encounter: 05/04/2013  Primary Care Provider:  No PCP Per Patient Primary Cardiologist:  Tobias Alexander, H  Patient Profile  Post ER visit  Problem List   Past Medical History  Diagnosis Date  . 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 Asthma   . Near syncope 01/30/13  . Heart palpitations     Happened during one of her syncope episodes  . Chest pain, mid sternal     Happens during syncope episodes   Past Surgical History  Procedure Laterality Date  . Wisdom tooth extraction    . Skull tumor resection     Allergies  Allergies  Allergen Reactions  . Tomato Hives   HPI  17 year old female without pertinent PMH who visited ER on 01/30/2013 for near syncope. The episode happened while she was exercising.  She has had similar episodes in the past and has not seen a physician for. She also complained of mild chest pain, substernal, nonradiating.  CXR and physical exam were unremarkable. ECG showed rightward axis. Otherwise normal.  It was concluded as vasovagal syncope and she was sent home.   The patient describes that she has had 5 of those episodes since the Labor day. They all happened on different occasions, one time she was running relay, other time she walked to the gas station, another time she was watching TV and fell on the coach. Every time she looses vision and eventually concioussness, she is then confused for about 5 minutes based on her roommate interview. She denies urinary incontinence, muscle twitching or involuntary movements. She states that some of them were preceded by palpitations, in others she was not aware of them. On one occasion when EMS was called her HR was 160. She states that her episodes are followed by midsternal chest pain.   Home Medications  Prior to Admission medications   Not on File    Family  History  Family History  Problem Relation Age of Onset  . Ataxia Neg Hx   . Chorea Neg Hx   . Dementia Neg Hx   . Mental retardation Neg Hx   . Migraines Neg Hx   . Multiple sclerosis Neg Hx   . Neurofibromatosis Neg Hx   . Neuropathy Neg Hx   . Parkinsonism Neg Hx   . Seizures Neg Hx   . Stroke Neg Hx   . Heart disease Maternal Grandmother   . Heart disease Maternal Aunt     Social History  History   Social History  . Marital Status: Single    Spouse Name: N/A    Number of Children: N/A  . Years of Education: N/A   Occupational History  . Not on file.   Social History Main Topics  . Smoking status: Never Smoker   . Smokeless tobacco: Never Used  . Alcohol Use: Yes     Comment: socially  . Drug Use: No  . Sexual Activity: Not on file   Other Topics Concern  . 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     Review of Systems General:  No chills, fever, night sweats or weight changes.  Cardiovascular:  No chest pain, dyspnea on exertion, edema, orthopnea, palpitations, paroxysmal nocturnal dyspnea. Dermatological: No rash, lesions/masses Respiratory: No cough,  dyspnea Urologic: No hematuria, dysuria Abdominal:   No nausea, vomiting, diarrhea, bright red blood per rectum, melena, or hematemesis Neurologic:  No visual changes, wkns, changes in mental status. All other systems reviewed and are otherwise negative except as noted above.  Physical Exam  Blood pressure 111/71, pulse 83, height 5\' 7"  (1.702 m), weight 145 lb (65.772 kg).  General: Pleasant, NAD Psych: Normal affect. Neuro: Alert and oriented X 3. Moves all extremities spontaneously. HEENT: Normal  Neck: Supple without bruits or JVD. Lungs:  Resp regular and unlabored, CTA. Heart: RRR no s3, s4, or murmurs. Abdomen: Soft, non-tender, non-distended, BS + x 4.  Extremities: No clubbing, cyanosis or edema. DP/PT/Radials 2+ and equal bilaterally.  Accessory  Clinical Findings  ECG - SR, 72 BPM, rightward axis  Echo 03/09/2013 Study Conclusions  - Left ventricle: The cavity size was normal. Systolic function was normal. The estimated ejection fraction was in the range of 55% to 60%. Wall motion was normal; there were no regional wall motion abnormalities. - Atrial septum: No defect or patent foramen ovale was identified. - Impressions: Normal coronary artery origins Impressions:  - Normal coronary artery origins    Assessment & Plan  A very pleasant young female with recurrent syncope in different situations. It is somewhat concerning and the differential is broad. We have to rule out a structural heart disease and a flow limiting arrhythmia. We will order an event monitor to wear for a month and an echocardiogram. All of her labs including TSH are normal. Her echocardiogram is normal.  She was see by Dr Johney Frame who felt that she has an orthostatic hypotension and discontinued Metoprolol. She was wearing e-cardio heart monitor that showed episodes of SVT with HR up to 180 BPM. She continues feelling lightheaded. No more syncope. She now reports exertionla dull chest pain that resolves at rest. We will order an exercise treadmill stress test to evaluate for symptoms and possible ECG changes eventhough very unlikely to have CAD at that age. Her echo showed normal origin of coronaries.     Tobias Alexander, Rexene Edison, MD  05/04/2013, 9:24 AM

## 2013-05-07 ENCOUNTER — Ambulatory Visit (HOSPITAL_COMMUNITY)
Admission: RE | Admit: 2013-05-07 | Discharge: 2013-05-07 | Disposition: A | Discharge status: Home / Self Care | Payer: BC Managed Care – PPO | Source: Ambulatory Visit | Attending: Cardiology | Admitting: Cardiology

## 2013-05-07 DIAGNOSIS — R079 Chest pain, unspecified: Secondary | ICD-10-CM

## 2013-05-19 ENCOUNTER — Ambulatory Visit: Payer: BC Managed Care – PPO | Admitting: Internal Medicine

## 2013-05-21 ENCOUNTER — Other Ambulatory Visit: Payer: Self-pay

## 2013-05-21 DIAGNOSIS — R079 Chest pain, unspecified: Secondary | ICD-10-CM

## 2013-05-31 ENCOUNTER — Ambulatory Visit (INDEPENDENT_AMBULATORY_CARE_PROVIDER_SITE_OTHER): Payer: BC Managed Care – PPO | Admitting: Internal Medicine

## 2013-05-31 ENCOUNTER — Encounter: Payer: Self-pay | Admitting: Internal Medicine

## 2013-05-31 VITALS — BP 109/78 | HR 99 | Ht 67.0 in | Wt 143.0 lb

## 2013-05-31 DIAGNOSIS — R55 Syncope and collapse: Secondary | ICD-10-CM

## 2013-05-31 DIAGNOSIS — R42 Dizziness and giddiness: Secondary | ICD-10-CM

## 2013-05-31 LAB — BASIC METABOLIC PANEL
BUN: 9 mg/dL (ref 6–23)
CALCIUM: 9.7 mg/dL (ref 8.4–10.5)
CO2: 29 meq/L (ref 19–32)
Chloride: 106 mEq/L (ref 96–112)
Creatinine, Ser: 0.8 mg/dL (ref 0.4–1.2)
GFR: 93.34 mL/min (ref >60.00)
GLUCOSE: 85 mg/dL (ref 70–99)
POTASSIUM: 4 meq/L (ref 3.5–5.1)
Sodium: 140 mEq/L (ref 135–145)

## 2013-05-31 MED ORDER — FLUDROCORTISONE ACETATE 0.1 MG PO TABS: 0.1000 mg | ORAL_TABLET | Freq: Every day | ORAL | Status: DC

## 2013-05-31 NOTE — Patient Instructions (Signed)
Your physician recommends that you schedule a follow-up appointment in: 6 weeks with Dr Rayann Heman   Your physician has recommended you make the following change in your medication:  1) Start Florinef 0.1mg  daily  Your physician recommends that you return for lab work today

## 2013-06-02 NOTE — Progress Notes (Signed)
Quick Note:  Preliminary report reviewed by triage nurse and sent to MD desk. ______ 

## 2013-06-04 DIAGNOSIS — R42 Dizziness and giddiness: Secondary | ICD-10-CM | POA: Insufficient documentation

## 2013-06-04 NOTE — Progress Notes (Signed)
PCP: No PCP Per Patient Primary Cardiologist:  Ariana Donovan is a 18 y.o. female who presents today for routine electrophysiology followup.  Since I saw her last, she has done reasonably well.  With lifestyle modification, she has had no frank syncope.  She continues to have some postural dizziness.  She has been diligent with salt liberalization and increased hydration.  She presents with her mother today and is very concerned about his symptoms.  Today, she denies symptoms of  chest pain, shortness of breath,  lower extremity edema,  or syncope.  The patient is otherwise without complaint today.   Past Medical History  Diagnosis Date  . 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 Asthma   . Near syncope 01/30/13  . Heart palpitations     Happened during one of her syncope episodes  . Chest pain, mid sternal     Happens during syncope episodes  . Dizziness 12/2012  . Chest tightness 02/2013    Was running a relay and felt fine until she stopped & had abrupt lightheadedness and chest tightness. She layed down & she had pre-syncope.  . Palpitations     Occasional tachypalpitations  . Sinus tachycardia   . Syncope and collapse 12/2012    Recurrent and happens in different situations   Past Surgical History  Procedure Laterality Date  . Wisdom tooth extraction    . Skull tumor resection      Current Outpatient Prescriptions  Medication Sig Dispense Refill  . fludrocortisone (FLORINEF) 0.1 MG tablet Take 1 tablet (0.1 mg total) by mouth daily.  90 tablet  3   No current facility-administered medications for this visit.    Physical Exam: Filed Vitals:   05/31/13 1211 05/31/13 1212 05/31/13 1214 05/31/13 1215  BP: 109/75 114/76 123/72 109/78  Pulse: 75 69 92 99  Height:      Weight:      SpO2: 99% 99% 99% 98%    GEN- The patient is anxious appearing, alert and oriented x 3 today.   Head- normocephalic, atraumatic Eyes-  Sclera clear, conjunctiva pink Ears-  hearing intact Oropharynx- clear Lungs- Clear to ausculation bilaterally, normal work of breathing Heart- Regular rate and rhythm, no murmurs, rubs or gallops, PMI not laterally displaced GI- soft, NT, ND, + BS Extremities- no clubbing, cyanosis, or edema  ekg today reveals sinus rhythm 68 bpm, PR 122, QRS 86, QTc 395, otherwise normal ekg Event monitor is again reviewed today.  SVT is long RP and llikely represents sinus tach.  Assessment and Plan:  1. Postural dizziness She remains orthostatic by heart rate today I will start florinef 0.1mg  daily as she continues to have difficulty despite adequate hydration and salt liberalization bmet today and upon return in 6 weeks I believe that her tachycardia on event monitor represents sinus tach.  I do not feel that EP procedures would be helpful at this time.  Return in 6 weeks

## 2013-06-08 ENCOUNTER — Encounter: Payer: Self-pay | Admitting: Cardiology

## 2013-06-14 ENCOUNTER — Telehealth: Payer: Self-pay | Admitting: General Surgery

## 2013-06-14 NOTE — Telephone Encounter (Signed)
Message copied by Lily Kocher on Mon Jun 14, 2013  3:09 PM ------      Message from: Dorothy Spark      Created: Mon Jun 14, 2013 12:36 PM       Could you schedule this patient for a coronary CT and call her about it?           The reason is an abnormal GXT.           Thank you,           Houston Siren   ------

## 2013-06-14 NOTE — Telephone Encounter (Signed)
I will forward to Triage to take care of for Dr Meda Coffee.

## 2013-06-16 ENCOUNTER — Ambulatory Visit (HOSPITAL_COMMUNITY)
Admission: RE | Admit: 2013-06-16 | Discharge: 2013-06-16 | Disposition: A | Discharge status: Home / Self Care | Payer: BC Managed Care – PPO | Source: Ambulatory Visit | Attending: Cardiology | Admitting: Cardiology

## 2013-06-16 DIAGNOSIS — R9439 Abnormal result of other cardiovascular function study: Secondary | ICD-10-CM | POA: Insufficient documentation

## 2013-06-16 DIAGNOSIS — R079 Chest pain, unspecified: Secondary | ICD-10-CM

## 2013-06-16 MED ORDER — NITROGLYCERIN 0.4 MG SL SUBL
SUBLINGUAL_TABLET | SUBLINGUAL | Status: AC
  Administered 2013-06-16: 14:00:00 0.4 mg via SUBLINGUAL
  Filled 2013-06-16: qty 25

## 2013-06-16 MED ORDER — NITROGLYCERIN 0.4 MG SL SUBL
0.4000 mg | SUBLINGUAL_TABLET | Freq: Once | SUBLINGUAL | Status: AC
  Administered 2013-06-16: 0.4 mg via SUBLINGUAL
  Filled 2013-06-16: qty 25

## 2013-06-16 MED ORDER — METOPROLOL TARTRATE 1 MG/ML IV SOLN
INTRAVENOUS | Status: AC
  Administered 2013-06-16: 14:00:00 2.5 mg via INTRAVENOUS
  Filled 2013-06-16: qty 5

## 2013-06-16 MED ORDER — IOHEXOL 350 MG/ML SOLN
80.0000 mL | Freq: Once | INTRAVENOUS | Status: AC | PRN
  Administered 2013-06-16: 80 mL via INTRAVENOUS

## 2013-06-16 MED ORDER — METOPROLOL TARTRATE 1 MG/ML IV SOLN
2.5000 mg | Freq: Once | INTRAVENOUS | Status: AC
  Administered 2013-06-16: 2.5 mg via INTRAVENOUS
  Filled 2013-06-16: qty 5

## 2013-07-02 ENCOUNTER — Ambulatory Visit: Payer: BC Managed Care – PPO | Admitting: Internal Medicine

## 2013-07-07 ENCOUNTER — Encounter: Payer: Self-pay | Admitting: Internal Medicine

## 2013-07-15 ENCOUNTER — Ambulatory Visit: Payer: BC Managed Care – PPO | Admitting: Internal Medicine

## 2013-09-02 ENCOUNTER — Encounter: Payer: Self-pay | Admitting: Internal Medicine

## 2013-09-02 ENCOUNTER — Ambulatory Visit (INDEPENDENT_AMBULATORY_CARE_PROVIDER_SITE_OTHER): Payer: BC Managed Care – PPO | Admitting: Internal Medicine

## 2013-09-02 VITALS — BP 120/78 | HR 130 | Ht 67.0 in | Wt 143.0 lb

## 2013-09-02 DIAGNOSIS — G901 Familial dysautonomia [Riley-Day]: Secondary | ICD-10-CM

## 2013-09-02 DIAGNOSIS — R42 Dizziness and giddiness: Secondary | ICD-10-CM

## 2013-09-02 DIAGNOSIS — R55 Syncope and collapse: Secondary | ICD-10-CM

## 2013-09-02 DIAGNOSIS — G909 Disorder of the autonomic nervous system, unspecified: Secondary | ICD-10-CM

## 2013-09-02 NOTE — Patient Instructions (Signed)
You have been referred to Dr Caryl Comes for dysautonomia   Will see Dr Rayann Heman as needed

## 2013-09-02 NOTE — Progress Notes (Signed)
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  Since I saw her last, she has done reasonably well.  With lifestyle modification, she has had no frank syncope.  She continues to have some postural dizziness.  She has been diligent with salt liberalization and increased hydration.  She did not improve with florinef.  She felt bloated and nauseated with this medicine but did not find that her symptoms of dizziness improved.  She stopped florinef and actually feels better.  Today, she denies symptoms of  chest pain, shortness of breath,  lower extremity edema,  or syncope.  The patient is otherwise without complaint today.   Past Medical History  Diagnosis Date  . 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 Asthma   . Near syncope 01/30/13  . Heart palpitations     Happened during one of her syncope episodes  . Chest pain, mid sternal     Happens during syncope episodes  . Dizziness 12/2012  . Chest tightness 02/2013    Was running a relay and felt fine until she stopped & had abrupt lightheadedness and chest tightness. She layed down & she had pre-syncope.  . Palpitations     Occasional tachypalpitations  . Sinus tachycardia   . Syncope and collapse 12/2012    Recurrent and happens in different situations   Past Surgical History  Procedure Laterality Date  . Wisdom tooth extraction    . Skull tumor resection      No current outpatient prescriptions on file.   No current facility-administered medications for this visit.    Physical Exam: Filed Vitals:   09/02/13 1230 09/02/13 1234 09/02/13 1236 09/02/13 1239  BP: 115/77 119/75 118/72 120/78  Pulse: 107 109 111 130  Height:      Weight:      SpO2: 95% 97% 98% 99%    GEN- The patient is anxious appearing, alert and oriented x 3 today.   Head- normocephalic, atraumatic Eyes-  Sclera clear, conjunctiva pink Ears-  hearing intact Oropharynx- clear Lungs- Clear to ausculation bilaterally, normal work of breathing Heart- Regular rate and rhythm, no murmurs, rubs or gallops, PMI not laterally displaced GI- soft, NT, ND, + BS Extremities- no clubbing, cyanosis, or edema  ekg last visit is reviewed today and reveals sinus rhythm 68 bpm, PR 122, QRS 86, QTc 395, otherwise normal ekg Event monitor is again reviewed today.  SVT is long RP and llikely represents sinus tach.  Assessment and Plan:  1. dysautonomia She remains orthostatic by heart rate today She did not respond to florinef She and her mother feel that Dr Meda Coffee and I are giving discordant information.  Dr Meda Coffee gave them the impression that she had SVT and I have felt that she had likely sinus tach. They request another opinion.  I have advised that Dr Caryl Comes has an amazing understanding of dysautonomia and SVT and would probably be the best person to provide additional insight.  I think that he could provide further benefit in the patients management of symptoms. I will therefore refer to Dr Caryl Comes I will see as needed going forward.

## 2013-09-22 ENCOUNTER — Ambulatory Visit: Payer: BC Managed Care – PPO | Admitting: Internal Medicine

## 2013-10-06 ENCOUNTER — Telehealth: Payer: Self-pay | Admitting: Internal Medicine

## 2013-10-06 NOTE — Telephone Encounter (Signed)
10-06-13 lmm @ 246pm letting pt know that a letter can be written for the stress test, echo, monitor and CT that were ordered by Meda Coffee with the dates/times of the test, her list of medicines , and that other test were done by [facility] and ( the MRI was ordered by Dr. Tomi Likens) and that info will need to come from that office, she is to call back if she would like to proceed with getting the letter

## 2013-10-06 NOTE — Telephone Encounter (Signed)
Pt has missed a lot of time from school due to "all the test she has had done", she is requesting a letter stating that she has had to come in for numerous appointments, a CT scan, MRI, and a stress test, and listing the medications she is taking. Addressed " To whom it may concern" , call pt and she will pick it up [phone].

## 2013-10-07 ENCOUNTER — Ambulatory Visit: Payer: BC Managed Care – PPO | Admitting: Internal Medicine

## 2013-11-17 ENCOUNTER — Encounter: Payer: Self-pay | Admitting: Internal Medicine

## 2013-11-17 ENCOUNTER — Ambulatory Visit (INDEPENDENT_AMBULATORY_CARE_PROVIDER_SITE_OTHER): Payer: BC Managed Care – PPO | Admitting: Internal Medicine

## 2013-11-17 VITALS — BP 113/75 | HR 65 | Ht 67.0 in | Wt 132.0 lb

## 2013-11-17 DIAGNOSIS — G909 Disorder of the autonomic nervous system, unspecified: Secondary | ICD-10-CM

## 2013-11-17 DIAGNOSIS — G901 Familial dysautonomia [Riley-Day]: Secondary | ICD-10-CM

## 2013-11-17 NOTE — Patient Instructions (Signed)
Your physician recommends that you continue on your current medications as directed. Please refer to the Current Medication list given to you today.  Your physician wants you to follow-up in: September with Dr. Caryl Comes.  You will receive a reminder letter in the mail two months in advance. If you don't receive a letter, please call our office to schedule the follow-up appointment.

## 2013-11-17 NOTE — Progress Notes (Signed)
ELECTROPHYSIOLOGY CONSULT NOTE  Patient ID: Ariana Donovan, MRN: [id number], DOB/AGE: 01-25-1996 18 y.o. Admit date: (Not on file) Date of Consult: 11/17/2013  Primary Physician: No PCP Per Patient Primary Cardiologist: KN/JA   Chief Complaint: syncope in terms of the   HPI Ariana Donovan is a 18 y.o. female  Referred for a history of recurrent syncope. Became problematic of a year ago. She was quite hot. She had a prodrome of nausea and diaphoresis. This occurred at a gas station and she did not report it to her mom  There was an episode a few weeks later following a fundraiser. 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 At the end of one of these relays while she was standing she had recurrence of the prodrome and lost consciousness. 95 were available. She is described as pale. She was referred to the emergency room.  These episodes typically can be attenuated by lying down but not entirely. There is significant residual orthostatic intolerance and subsequent fatigue as well some degree of confusion.  She has heat intolerance as well as shower intolerance. She has not noticed significant problems related to her menses.  Her diet is salt and fluid  Deplete  Seh does not wear glasses    Past Medical History  Diagnosis Date  . 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 Asthma   . Near syncope 01/30/13  . Heart palpitations     Happened during one of her syncope episodes  . Chest pain, mid sternal     Happens during syncope episodes  . Dizziness 12/2012  . Chest tightness 02/2013    Was running a relay and felt fine until she stopped & had abrupt lightheadedness and chest tightness. She layed down & she had pre-syncope.  . Palpitations     Occasional tachypalpitations  . Sinus tachycardia   . Syncope and collapse 12/2012    Recurrent and happens in different situations  . Seasonal allergies   . Fatigue   . Anxiety       Surgical History:  Past Surgical History   Procedure Laterality Date  . Wisdom tooth extraction    . Skull tumor resection       Home Meds: Prior to Admission medications   Not on File      Allergies:  Allergies  Allergen Reactions  . Tomato Hives    History   Social History  . Marital Status: Single    Spouse Name: N/A    Number of Children: N/A  . Years of Education: N/A   Occupational History  . Not on file.   Social History Main Topics  . Smoking status: Never Smoker   . Smokeless tobacco: Never Used  . Alcohol Use: Yes     Comment: socially  . Drug Use: No  . Sexual Activity: Not on file   Other Topics Concern  . Not on file   Social History Narrative   Secretary/administrator.   From [place].     Family History  Problem Relation Age of Onset  . Ataxia Neg Hx   . Chorea Neg Hx   . Dementia Neg Hx   . Mental retardation Neg Hx   . Migraines Neg Hx   . Multiple sclerosis Neg Hx   . Neurofibromatosis Neg Hx   . Neuropathy Neg Hx   . Parkinsonism Neg Hx   . Seizures Neg Hx   . Stroke Neg Hx   .  Heart disease Maternal Grandmother   . Heart disease Maternal Aunt   . Asthma Mother      ROS:  Please see the history of present illness.     All other systems reviewed and negative.    Physical Exam   Blood pressure 113/75, pulse 65, height 5\' 7"  (1.702 m), weight 132 lb (59.875 kg). General: Well developed, well nourished female in no acute distress. Head: Normocephalic, atraumatic, sclera non-icteric, no xanthomas, nares are without discharge. EENT: normal Lymph Nodes:  none Back: without scoliosis/kyphosis *, no CVA tendersness Neck: Negative for carotid bruits. JVD not elevated. Lungs: Clear bilaterally to auscultation without wheezes, rales, or rhonchi. Breathing is unlabored. Chest without breast bone deformity Heart: RRR with S1 S2. No  murmur , rubs, or gallops appreciated. Abdomen: Soft, non-tender, non-distended with normoactive bowel sounds. No hepatomegaly. No rebound/guarding. No  obvious abdominal masses. Msk:  Strength and tone appear normal for age. Extremities: No clubbing or cyanosis. No* dema.  Distal pedal pulses are 2+ and equal bilaterally. A rapid atrial lead on-PT, vomiting position, and a leaving standing greater than height Skin: Warm and Dry Neuro: Alert and oriented X 3. CN III-XII intact Grossly normal sensory and motor function . Psych:  Responds to questions appropriately with a normal affect.      Labs: Cardiac Enzymes No results found for this basename: CKTOTAL, CKMB, TROPONINI,  in the last 72 hours CBC Lab Results  Component Value Date   WBC 7.1 02/19/2013   HGB 13.5 02/19/2013   HCT 40.7 02/19/2013   MCV 88.6 02/19/2013   PLT 287.0 02/19/2013   PROTIME: No results found for this basename: LABPROT, INR,  in the last 72 hours Chemistry No results found for this basename: NA, K, CL, CO2, BUN, CREATININE, CALCIUM, LABALBU, PROT, BILITOT, ALKPHOS, ALT, AST, GLUCOSE,  in the last 168 hours Lipids No results found for this basename: CHOL, HDL, LDLCALC, TRIG   BNP No results found for this basename: probnp   Miscellaneous No results found for this basename: DDIMER    Radiology/Studies:  No results found.  EKG:  Sinus rhythm at 68 Intervals 04/13/36 axis is leftward at + 90    Assessment and Plan:   Dysautonomia  Marfanoid Habitus/joint laxity   The patient has symptoms consistent with dysautonomia and neurally mediated syncope. We reviewed extensively the physiology of orthostatic stress and the implications of ambient heat, intravascular volume depletion, prolonged standing  We discussed the importance of aerobic exercise and the need to be cautious at the end of exercise. I have given her information from [employer] website.  She also has a marfanoid habitus. There is a family history also of marfanoid persons. There is no known history of aortic dissection. I will have Dr. Meda Coffee review her echo to be sure there is no issues  related to the aortic root.    Virl Axe

## 2014-01-05 ENCOUNTER — Telehealth: Payer: Self-pay | Admitting: Internal Medicine

## 2014-01-05 NOTE — Telephone Encounter (Signed)
Pt moved to [place], [place], near [place], wanted to know if Dr. Caryl Comes knew/would recommend anyone in that area??

## 2014-01-06 ENCOUNTER — Ambulatory Visit: Payer: BC Managed Care – PPO | Admitting: Internal Medicine

## 2014-01-12 NOTE — Telephone Encounter (Signed)
Left message explaining that Dr. Caryl Comes was not aware of a doctor to refer her to in [place]. Told her to call us with further questions if needed.

## 2014-04-29 IMAGING — CT CT HEART MORP W/ CTA COR W/ SCORE W/ CA W/CM &/OR W/O CM
1 of 10 series · 3 of 20 positions shown, 4 images · IV contrast (CONTRAST)
Comparison: None.

CLINICAL DATA: Chest pain, abnormal exercise treadmill stress test

EXAM:
Cardiac/Coronary  CT
TECHNIQUE: The patient was scanned on a Philips [id number] scanner.

[Series 10: w/o ec, 75.0% · axial · non-contrast · 0.33mm/px · z∈[-273,-99]mm · 3 of 437 slices shown, 4 images]
[im 1/437  vessel]
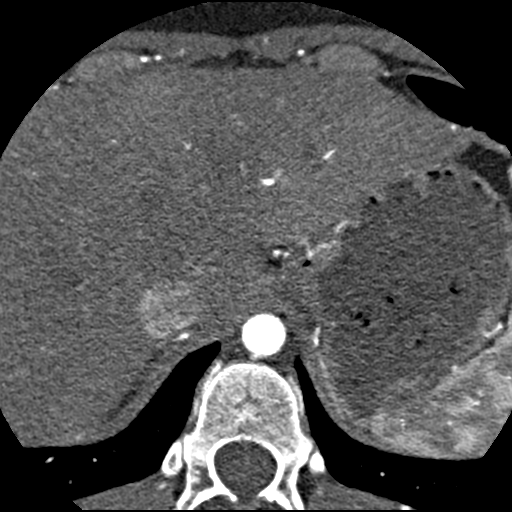
[im 1/437  lung]
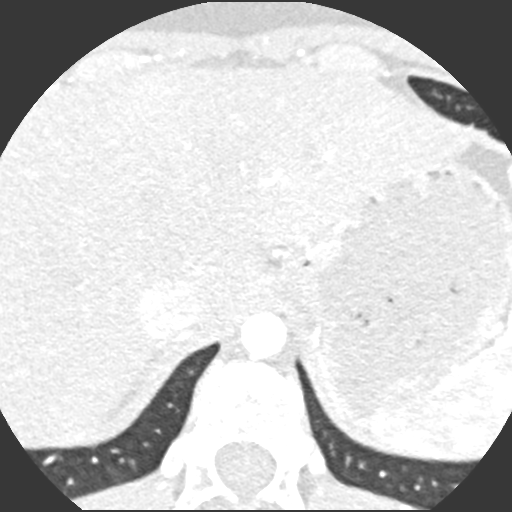
[im 219/437  vessel]
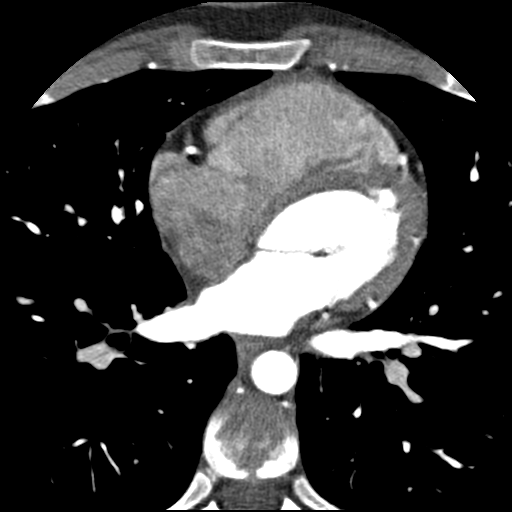
[im 437/437  vessel]
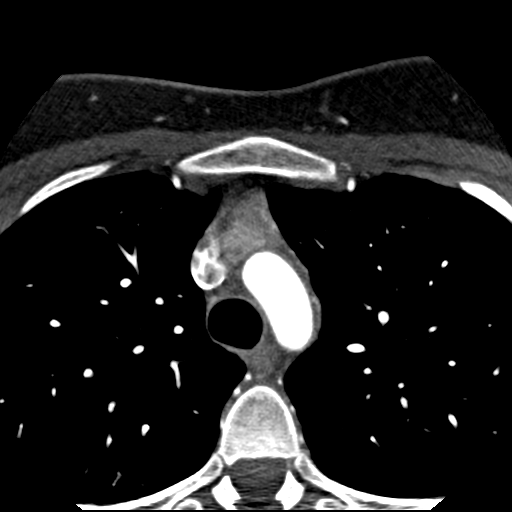

[3 of 20 positions shown; findings below may reference images not displayed]

FINDINGS: A 120 kV prospective scan was triggered in the descending thoracic
aorta at 111 HU's. Axial non-contrast 3mm slices were carried out
through the heart. The data set was analyzed on a dedicated work
station and scored using the Agatson method. Gantry rotation speed
was 270 msecs and collimation was .9 mm. 2.5 mg of iv Metoprolol and
0.4 mg of sl NTG was given. The 3D data set was reconstructed in 5%
intervals of the 67-82 % of the R-R cycle. Diastolic phases were
analyzed on a dedicated work station using MPR, MIP and VRT modes.
The patient received 80 cc of contrast.

Coronary Arteries: Originating in a normal position. There is right
dominance.

Left main is a large vessel without atherosclerotic plague. LM gives
rise to LAD and non-dominant LCX.

LAD is a large vessel that wraps around the apex and continues in
the inferior interventricular groove. There is no plague in LAD. LAD
gives rise to a medium size diagonal vessel and two very small
diagonal branches.

LCX is a medium size non-dominant vessel that has no obvious plague
and gives rise to two obtuse marginal branches. The OM1 is a large
branch without evidence of plague. OM2 is very small.

RCA is a large dominant vessel that gives rise to 4 acute marginal
branches and PDA. There is plague in the RCA territory.
IMPRESSION: 1. Coronary calcium score of 0. This was 0 percentile for age and
sex matched control.

2. Normal coronaries.  Normal origin, no myocardial bridging.

Noriko Angelica

EXAM:
OVER-READ INTERPRETATION  CT CHEST

The following report is an over-read performed by radiologist Dr.
Miche Ming [[email or web address]] on 06/16/2013. This over-read
does not include interpretation of cardiac or coronary anatomy or
pathology. The coronary CTA interpretation by the cardiologist is
attached.
FINDINGS: Visualized lung fields are clear. No pleural effusions or focal
opacities. No adenopathy in the visualized lower mediastinum or
hila. Chest wall soft tissues are unremarkable. Visualized aorta is
normal caliber. Imaging into the upper abdomen shows no acute
findings.

No filling defects in the visualized pulmonary arteries to suggest
pulmonary emboli.

No acute bony abnormality.
IMPRESSION: No acute or significant extracardiac abnormality.
